# Patient Record
Sex: Female | Born: 1945 | Race: Black or African American | Hispanic: No | Smoking: Never smoker
Health system: Southern US, Community
[De-identification: ages and names within clinical notes are randomized; demographics above are authoritative.]

## PROBLEM LIST (undated history)

## (undated) HISTORY — PX: APPENDECTOMY: SHX54

## (undated) HISTORY — PX: OTHER SURGICAL HISTORY: SHX169

---

## 1989-01-11 HISTORY — PX: BREAST EXCISIONAL BIOPSY: SUR124

## 1998-04-28 ENCOUNTER — Other Ambulatory Visit: Admission: RE | Admit: 1998-04-28 | Discharge: 1998-04-28 | Payer: Self-pay | Admitting: Obstetrics & Gynecology

## 1998-08-21 ENCOUNTER — Ambulatory Visit (HOSPITAL_BASED_OUTPATIENT_CLINIC_OR_DEPARTMENT_OTHER): Admission: RE | Admit: 1998-08-21 | Discharge: 1998-08-21 | Payer: Self-pay | Admitting: Orthopedic Surgery

## 1998-11-17 ENCOUNTER — Encounter: Admission: RE | Admit: 1998-11-17 | Discharge: 1998-11-17 | Payer: Self-pay | Admitting: Rheumatology

## 1998-11-17 ENCOUNTER — Encounter: Payer: Self-pay | Admitting: Obstetrics & Gynecology

## 1999-03-05 ENCOUNTER — Ambulatory Visit (HOSPITAL_COMMUNITY): Admission: RE | Admit: 1999-03-05 | Discharge: 1999-03-05 | Payer: Self-pay | Admitting: Gastroenterology

## 1999-04-26 ENCOUNTER — Other Ambulatory Visit: Admission: RE | Admit: 1999-04-26 | Discharge: 1999-04-26 | Payer: Self-pay | Admitting: Gynecology

## 1999-04-26 ENCOUNTER — Encounter (INDEPENDENT_AMBULATORY_CARE_PROVIDER_SITE_OTHER): Payer: Self-pay

## 2000-08-05 ENCOUNTER — Encounter: Admission: RE | Admit: 2000-08-05 | Discharge: 2000-08-05 | Payer: Self-pay | Admitting: Family Medicine

## 2000-08-05 ENCOUNTER — Encounter: Payer: Self-pay | Admitting: Family Medicine

## 2002-01-12 ENCOUNTER — Encounter: Payer: Self-pay | Admitting: Family Medicine

## 2002-01-12 ENCOUNTER — Encounter: Admission: RE | Admit: 2002-01-12 | Discharge: 2002-01-12 | Payer: Self-pay | Admitting: Family Medicine

## 2003-01-25 ENCOUNTER — Encounter: Admission: RE | Admit: 2003-01-25 | Discharge: 2003-01-25 | Payer: Self-pay

## 2004-02-07 ENCOUNTER — Encounter: Admission: RE | Admit: 2004-02-07 | Discharge: 2004-02-07 | Payer: Self-pay | Admitting: Family Medicine

## 2005-05-20 ENCOUNTER — Encounter: Admission: RE | Admit: 2005-05-20 | Discharge: 2005-05-20 | Payer: Self-pay | Admitting: Family Medicine

## 2005-06-15 ENCOUNTER — Encounter: Admission: RE | Admit: 2005-06-15 | Discharge: 2005-06-15 | Payer: Self-pay | Admitting: Family Medicine

## 2005-07-01 ENCOUNTER — Encounter: Admission: RE | Admit: 2005-07-01 | Discharge: 2005-07-01 | Payer: Self-pay | Admitting: Family Medicine

## 2005-07-07 ENCOUNTER — Encounter: Admission: RE | Admit: 2005-07-07 | Discharge: 2005-07-07 | Payer: Self-pay | Admitting: Family Medicine

## 2009-09-11 ENCOUNTER — Other Ambulatory Visit: Admission: RE | Admit: 2009-09-11 | Discharge: 2009-09-11 | Payer: Self-pay | Admitting: Obstetrics and Gynecology

## 2010-01-31 ENCOUNTER — Encounter: Payer: Self-pay | Admitting: Family Medicine

## 2010-05-29 NOTE — Op Note (Signed)
[place]. [facility]  Patient:    Gail Strong, Gail Strong                   MRN: [id number] Proc. Date: 03/05/99 Adm. Date:  [id number] Attending:  Charna Elizabeth CC:         Doreatha Lew, M.D.                           Operative Report  REFERRING PHYSICIAN:  Doreatha Lew, M.D.  PROCEDURE:  Colonoscopy.  ENDOSCOPIST:  Anselmo Rod, M.D.  INSTRUMENT USED:  Olympus video colonoscope.  INDICATIONS:  Guaiac-positive stool in a 65 year old black female.  Rule out masses, polyps, hemorrhoids, etc.  PREPROCEDURE PREPARATION:  Informed consent was procured from the patient.  The  patient was fasted for eight hours prior to the procedure and prepped with a bottle of magnesium citrate and a gallon of NuLytely the night prior to the procedure.   PREPROCEDURE PHYSICAL EXAMINATION:  VITAL SIGNS: The patient had stable vital signs.  NECK:  Supple.  CHEST: Clear to auscultation.  HEART: S1 and S2 regular. ABDOMEN: Soft with normal abdominal bowel sounds.  DESCRIPTION OF PROCEDURE:  The patient was placed in the left lateral decubitus  position and sedated with 60 mg of Demerol and 6 mg of Versed intravenously. Once the patient was adequately sedated and maintained on low flow oxygen with continuous cardiac monitoring, the Olympus video colonoscope was advanced from he rectum to the cecum with extreme difficulty.  There was a large amount of residual stool in the colon.  No large masses or polyps were seen.  There were small nonbleeding internal and small external hemorrhoids seen.  The patient tolerated the procedure well.  The procedure was completed up to the cecum, but visualization was not adequate.  However, no large lesions were present.  The patient tolerated the procedure well.  A very small lesion may have been missed.  IMPRESSION: 1. Small nonbleeding internal and external hemorrhoids. 2. Large amount of residual stool in the colon.  No  large masses or polyps present. 3. Inadequate visualization of the colon.  Very small lesion may have been missed.  RECOMMENDATIONS:  Await outpatient follow-up in the next two weeks for repeat guaiac testing and further recommendations made thereafter. DD:  03/05/99 TD:  03/05/99 Job: [id number]

## 2010-09-10 ENCOUNTER — Inpatient Hospital Stay (INDEPENDENT_AMBULATORY_CARE_PROVIDER_SITE_OTHER)
Admission: RE | Admit: 2010-09-10 | Discharge: 2010-09-10 | Disposition: A | Discharge status: Home / Self Care | Payer: BC Managed Care – PPO | Source: Ambulatory Visit | Attending: Family Medicine | Admitting: Family Medicine

## 2010-09-10 DIAGNOSIS — L259 Unspecified contact dermatitis, unspecified cause: Secondary | ICD-10-CM

## 2010-10-17 ENCOUNTER — Emergency Department (INDEPENDENT_AMBULATORY_CARE_PROVIDER_SITE_OTHER): Payer: BC Managed Care – PPO

## 2010-10-17 ENCOUNTER — Emergency Department (HOSPITAL_BASED_OUTPATIENT_CLINIC_OR_DEPARTMENT_OTHER)
Admission: EM | Admit: 2010-10-17 | Discharge: 2010-10-17 | Disposition: A | Discharge status: Home / Self Care | Payer: BC Managed Care – PPO | Attending: Emergency Medicine | Admitting: Emergency Medicine

## 2010-10-17 ENCOUNTER — Encounter: Payer: Self-pay | Admitting: *Deleted

## 2010-10-17 DIAGNOSIS — R071 Chest pain on breathing: Secondary | ICD-10-CM

## 2010-10-17 DIAGNOSIS — M549 Dorsalgia, unspecified: Secondary | ICD-10-CM

## 2010-10-17 MED ORDER — NAPROXEN 500 MG PO TABS: 500.0000 mg | ORAL_TABLET | Freq: Two times a day (BID) | ORAL | Status: AC

## 2010-10-17 NOTE — ED Notes (Signed)
MD at bedside. EDP Miller in to assess pt

## 2010-10-17 NOTE — ED Provider Notes (Signed)
History     CSN: [id number] Arrival date & time: 10/17/2010  2:13 AM  Chief Complaint  Patient presents with  . Back Pain    (Consider location/radiation/quality/duration/timing/severity/associated sxs/prior treatment) HPI Comments: Patient is a very pleasant 65 year old female who presents after acute onset of left rhomboid area sharp and stabbing pain which occurred while she was trying to transition from a laying position to a upright position. This lasted for approximately one hour as he gradually improved and currently has minimal symptoms. She denies any pain with breathing or movement or palpation. She admits to having approximately 3 or 4 days of cough, congestion which she states is her usual change of the seasons cold.  Patient is a 65 y.o. female presenting with back pain. The history is provided by the patient.  Back Pain  This is a new problem. The current episode started 1 to 2 hours ago. The problem occurs constantly. The problem has been rapidly improving. The pain is associated with no known injury (Sitting up out of a laying down position). Pain location: Left upper back medial to the scapula, lateral to the spinous process. The quality of the pain is described as stabbing. The pain does not radiate. The pain is at a severity of 1/10. The pain is mild. Exacerbated by: Nothing. Pertinent negatives include no chest pain, no fever, no numbness, no headaches, no abdominal pain, no bowel incontinence, no perianal numbness, no bladder incontinence, no dysuria, no leg pain, no paresthesias, no paresis, no tingling and no weakness. She has tried nothing for the symptoms.    History reviewed. No pertinent past medical history.  Past Surgical History  Procedure Date  . Cyst removal breast and shoulder   . Appendectomy     No family history on file.  History  Substance Use Topics  . Smoking status: Never Smoker   . Smokeless tobacco: Not on file  . Alcohol Use: Yes     rare  wine    OB History    Grav Para Term Preterm Abortions TAB SAB Ect Mult Living                  Review of Systems  Constitutional: Negative for fever.  Cardiovascular: Negative for chest pain.  Gastrointestinal: Negative for abdominal pain and bowel incontinence.  Genitourinary: Negative for bladder incontinence and dysuria.  Musculoskeletal: Positive for back pain.  Neurological: Negative for tingling, weakness, numbness, headaches and paresthesias.  All other systems reviewed and are negative.    Allergies  Review of patient's allergies indicates no known allergies.  Home Medications  No current outpatient prescriptions on file.  BP 145/76  Pulse 71  Temp(Src) 98.6 F (37 C) (Oral)  Resp 20  SpO2 99%  Physical Exam  Nursing note and vitals reviewed. Constitutional: She appears well-developed and well-nourished. No distress.  HENT:  Head: Normocephalic and atraumatic.  Mouth/Throat: Oropharynx is clear and moist. No oropharyngeal exudate.  Eyes: Conjunctivae and EOM are normal. Pupils are equal, round, and reactive to light. Right eye exhibits no discharge. Left eye exhibits no discharge. No scleral icterus.  Neck: Normal range of motion. Neck supple. No JVD present. No thyromegaly present.  Cardiovascular: Normal rate, regular rhythm, normal heart sounds and intact distal pulses.  Exam reveals no gallop and no friction rub.   No murmur heard. Pulmonary/Chest: Effort normal and breath sounds normal. No respiratory distress. She has no wheezes. She has no rales. She exhibits no tenderness.  Abdominal: Soft. Bowel sounds  are normal. She exhibits no distension and no mass. There is no tenderness.  Musculoskeletal: Normal range of motion. She exhibits no edema and no tenderness.       No tenderness to palpation in the left back. No tenderness with stretching of the rhomboid muscles  Lymphadenopathy:    She has no cervical adenopathy.  Neurological: She is alert.  Coordination normal.  Skin: Skin is warm and dry. No rash noted. No erythema.  Psychiatric: She has a normal mood and affect. Her behavior is normal.    ED Course  Procedures (including critical care time)   No diagnosis found.    MDM  Lungs are auscultated and clear, vital signs are normal, abdominal exam benign, neurologic exam normal, gait normal. I suspect that her symptoms are related to the cough and cold that she has had over the last several days. The pain is consistent with a mild pleurisy which has almost completely resolved. The patient states she can hardly feel the symptoms at this time. We'll obtain a chest x-ray to rule out a small pneumothorax or pneumonia underlying this pain.  Xray negative - see report below - VS normal including sats and pulse.  Ibuprofen recommended, pt has near resolution of sx.  doubt any other source of symptoms given no chest pain, shortness of breath, swelling, fever, normal vital signs, well-appearing. Given that her symptoms started with the motion of trying to get to an upright position I suspect that this is related to pain in her intercostal muscles or a slight amount of pleurisy.  No results found for this or any previous visit. Dg Chest 2 View  10/17/2010  *RADIOLOGY REPORT*  Clinical Data: Left mid back pain, acute onset.  Pleuritic pain.  CHEST - 2 VIEW  Comparison: None.  Findings: Cardiomediastinal contours upper normal limits to mildly enlarged, with mild central vascular fullness.  No overt edema.  No pleural effusion or pneumothorax.  No focal areas of consolidation. Mild curvature of the thoracolumbar spine.  No aggressive osseous abnormality.  IMPRESSION: No focal consolidation.  Cardiomediastinal contours upper normal limits.  Original Report Authenticated By: Waneta Martins, M.D.        Vida Roller, MD 10/17/10 [phone]

## 2010-10-17 NOTE — ED Notes (Signed)
Pt reports cold sx since Sunday- tonight had stabbing pain in upper back which has now resolved by pt reports she wanted to "get it checked out anyway".

## 2012-02-02 ENCOUNTER — Other Ambulatory Visit: Payer: Self-pay | Admitting: Family Medicine

## 2012-02-02 DIAGNOSIS — R922 Inconclusive mammogram: Secondary | ICD-10-CM

## 2012-02-15 ENCOUNTER — Ambulatory Visit
Admission: RE | Admit: 2012-02-15 | Discharge: 2012-02-15 | Disposition: A | Discharge status: Home / Self Care | Payer: Medicare PPO | Source: Ambulatory Visit | Attending: Family Medicine | Admitting: Family Medicine

## 2012-02-15 DIAGNOSIS — R922 Inconclusive mammogram: Secondary | ICD-10-CM

## 2013-04-18 ENCOUNTER — Other Ambulatory Visit: Payer: Self-pay

## 2013-04-18 DIAGNOSIS — Z1231 Encounter for screening mammogram for malignant neoplasm of breast: Secondary | ICD-10-CM

## 2013-04-27 ENCOUNTER — Ambulatory Visit
Admission: RE | Admit: 2013-04-27 | Discharge: 2013-04-27 | Disposition: A | Discharge status: Home / Self Care | Payer: Medicare PPO | Source: Ambulatory Visit

## 2013-04-27 DIAGNOSIS — Z1231 Encounter for screening mammogram for malignant neoplasm of breast: Secondary | ICD-10-CM

## 2014-04-01 ENCOUNTER — Other Ambulatory Visit: Payer: Self-pay

## 2014-04-01 DIAGNOSIS — Z1231 Encounter for screening mammogram for malignant neoplasm of breast: Secondary | ICD-10-CM

## 2014-04-29 ENCOUNTER — Ambulatory Visit
Admission: RE | Admit: 2014-04-29 | Discharge: 2014-04-29 | Disposition: A | Discharge status: Home / Self Care | Payer: Medicare PPO | Source: Ambulatory Visit

## 2014-04-29 DIAGNOSIS — Z1231 Encounter for screening mammogram for malignant neoplasm of breast: Secondary | ICD-10-CM

## 2014-09-13 DIAGNOSIS — R030 Elevated blood-pressure reading, without diagnosis of hypertension: Secondary | ICD-10-CM | POA: Diagnosis not present

## 2014-09-13 DIAGNOSIS — Z23 Encounter for immunization: Secondary | ICD-10-CM | POA: Diagnosis not present

## 2014-09-13 DIAGNOSIS — M25561 Pain in right knee: Secondary | ICD-10-CM | POA: Diagnosis not present

## 2014-09-15 ENCOUNTER — Encounter (HOSPITAL_COMMUNITY): Payer: Self-pay | Admitting: *Deleted

## 2014-09-15 ENCOUNTER — Emergency Department (INDEPENDENT_AMBULATORY_CARE_PROVIDER_SITE_OTHER)
Admission: EM | Admit: 2014-09-15 | Discharge: 2014-09-15 | Disposition: A | Discharge status: Home / Self Care | Payer: Medicare PPO | Source: Home / Self Care | Attending: Family Medicine | Admitting: Family Medicine

## 2014-09-15 DIAGNOSIS — I10 Essential (primary) hypertension: Secondary | ICD-10-CM

## 2014-09-15 LAB — POCT I-STAT, CHEM 8
BUN: 18 mg/dL (ref 6–20)
CHLORIDE: 104 mmol/L (ref 101–111)
Calcium, Ion: 1.2 mmol/L (ref 1.13–1.30)
Creatinine, Ser: 0.8 mg/dL (ref 0.44–1.00)
Glucose, Bld: 142 mg/dL — ABNORMAL HIGH (ref 65–99)
HEMATOCRIT: 42 % (ref 36.0–46.0)
Hemoglobin: 14.3 g/dL (ref 12.0–15.0)
Potassium: 3.9 mmol/L (ref 3.5–5.1)
SODIUM: 141 mmol/L (ref 135–145)
TCO2: 26 mmol/L (ref 0–100)

## 2014-09-15 MED ORDER — HYDROCHLOROTHIAZIDE 25 MG PO TABS: 25.0000 mg | ORAL_TABLET | Freq: Every day | ORAL | Status: AC

## 2014-09-15 NOTE — ED Notes (Signed)
Pt  denys  A  History  Of  Hypertension     Pt reports  Her  bp  Was  High  This  Am  When   She  Checked         At  The  Pharmacy       she  denys  Any  Symptoms

## 2014-09-15 NOTE — Discharge Instructions (Signed)
See your doctor in 2 weeks as planned to recheck your blood pressure and your sugar to eval diabetes.

## 2014-09-15 NOTE — ED Provider Notes (Signed)
CSN: [id number]     Arrival date & time 09/15/14  1302 History   First MD Initiated Contact with Patient 09/15/14 1317     Chief Complaint  Patient presents with  . Hypertension   (Consider location/radiation/quality/duration/timing/severity/associated sxs/prior Treatment) Patient is a 69 y.o. female presenting with hypertension. The history is provided by the patient.  Hypertension This is a new problem. The current episode started more than 2 days ago (sl elevated bp on fri at [facility], was to have rechecked in 2 wks but yest and today was higher and pt became worried because mother had cva from hbp, , sister and brother with hbp.). The problem has been gradually worsening. Pertinent negatives include no chest pain, no abdominal pain, no headaches and no shortness of breath.    History reviewed. No pertinent past medical history. Past Surgical History  Procedure Laterality Date  . Cyst removal breast and shoulder    . Appendectomy     History reviewed. No pertinent family history. Social History  Substance Use Topics  . Smoking status: Never Smoker   . Smokeless tobacco: None  . Alcohol Use: Yes     Comment: rare wine   OB History    No data available     Review of Systems  Respiratory: Negative for shortness of breath.   Cardiovascular: Negative for chest pain, palpitations and leg swelling.  Gastrointestinal: Negative for abdominal pain.  Neurological: Negative for headaches.  All other systems reviewed and are negative.   Allergies  Review of patient's allergies indicates no known allergies.  Home Medications   Prior to Admission medications   Medication Sig Start Date End Date Taking? Authorizing Provider  hydrochlorothiazide (HYDRODIURIL) 25 MG tablet Take 1 tablet (25 mg total) by mouth daily. 09/15/14   Linna Hoff, MD   Meds Ordered and Administered this Visit  Medications - No data to display  BP 178/91 mmHg  Pulse 73  Temp(Src) 98.1 F (36.7 C) (Oral)   Resp 18  SpO2 97% No data found.   Physical Exam  Constitutional: She is oriented to person, place, and time. She appears well-developed and well-nourished.  Eyes: Pupils are equal, round, and reactive to light.  Neck: Normal range of motion. Neck supple. Normal carotid pulses and no JVD present. Carotid bruit is not present.  Cardiovascular: Normal rate, normal heart sounds and intact distal pulses.   Pulmonary/Chest: Effort normal and breath sounds normal.  Lymphadenopathy:    She has no cervical adenopathy.  Neurological: She is alert and oriented to person, place, and time.  Skin: Skin is warm and dry.  Nursing note and vitals reviewed.   ED Course  Procedures (including critical care time)  Labs Review Labs Reviewed  POCT I-STAT, CHEM 8 - Abnormal; Notable for the following:    Glucose, Bld 142 (*)    All other components within normal limits   i-stat wnl except bs 142 Imaging Review No results found.   Visual Acuity Review  Right Eye Distance:   Left Eye Distance:   Bilateral Distance:    Right Eye Near:   Left Eye Near:    Bilateral Near:         MDM   1. Essential hypertension     rx for hctz given until [facility] follow-up.    Linna Hoff, MD 09/15/14 [phone]

## 2014-10-01 DIAGNOSIS — M629 Disorder of muscle, unspecified: Secondary | ICD-10-CM | POA: Diagnosis not present

## 2014-10-01 DIAGNOSIS — M25561 Pain in right knee: Secondary | ICD-10-CM | POA: Diagnosis not present

## 2014-10-04 DIAGNOSIS — M629 Disorder of muscle, unspecified: Secondary | ICD-10-CM | POA: Diagnosis not present

## 2014-10-04 DIAGNOSIS — M25561 Pain in right knee: Secondary | ICD-10-CM | POA: Diagnosis not present

## 2014-10-08 DIAGNOSIS — I10 Essential (primary) hypertension: Secondary | ICD-10-CM | POA: Diagnosis not present

## 2014-10-09 DIAGNOSIS — M629 Disorder of muscle, unspecified: Secondary | ICD-10-CM | POA: Diagnosis not present

## 2014-10-09 DIAGNOSIS — M25561 Pain in right knee: Secondary | ICD-10-CM | POA: Diagnosis not present

## 2014-10-11 DIAGNOSIS — M25561 Pain in right knee: Secondary | ICD-10-CM | POA: Diagnosis not present

## 2014-10-11 DIAGNOSIS — M629 Disorder of muscle, unspecified: Secondary | ICD-10-CM | POA: Diagnosis not present

## 2014-10-15 DIAGNOSIS — M25561 Pain in right knee: Secondary | ICD-10-CM | POA: Diagnosis not present

## 2014-10-15 DIAGNOSIS — M629 Disorder of muscle, unspecified: Secondary | ICD-10-CM | POA: Diagnosis not present

## 2014-10-18 DIAGNOSIS — M629 Disorder of muscle, unspecified: Secondary | ICD-10-CM | POA: Diagnosis not present

## 2014-10-18 DIAGNOSIS — M25561 Pain in right knee: Secondary | ICD-10-CM | POA: Diagnosis not present

## 2014-10-22 DIAGNOSIS — M25561 Pain in right knee: Secondary | ICD-10-CM | POA: Diagnosis not present

## 2014-10-22 DIAGNOSIS — M629 Disorder of muscle, unspecified: Secondary | ICD-10-CM | POA: Diagnosis not present

## 2014-10-25 DIAGNOSIS — M629 Disorder of muscle, unspecified: Secondary | ICD-10-CM | POA: Diagnosis not present

## 2014-10-25 DIAGNOSIS — M25561 Pain in right knee: Secondary | ICD-10-CM | POA: Diagnosis not present

## 2014-10-29 DIAGNOSIS — M629 Disorder of muscle, unspecified: Secondary | ICD-10-CM | POA: Diagnosis not present

## 2014-10-29 DIAGNOSIS — M25561 Pain in right knee: Secondary | ICD-10-CM | POA: Diagnosis not present

## 2014-10-31 DIAGNOSIS — M25561 Pain in right knee: Secondary | ICD-10-CM | POA: Diagnosis not present

## 2014-10-31 DIAGNOSIS — M629 Disorder of muscle, unspecified: Secondary | ICD-10-CM | POA: Diagnosis not present

## 2014-11-05 DIAGNOSIS — M25561 Pain in right knee: Secondary | ICD-10-CM | POA: Diagnosis not present

## 2014-11-05 DIAGNOSIS — M629 Disorder of muscle, unspecified: Secondary | ICD-10-CM | POA: Diagnosis not present

## 2014-11-08 DIAGNOSIS — M25561 Pain in right knee: Secondary | ICD-10-CM | POA: Diagnosis not present

## 2014-11-08 DIAGNOSIS — M629 Disorder of muscle, unspecified: Secondary | ICD-10-CM | POA: Diagnosis not present

## 2014-11-12 DIAGNOSIS — M629 Disorder of muscle, unspecified: Secondary | ICD-10-CM | POA: Diagnosis not present

## 2014-11-12 DIAGNOSIS — M25561 Pain in right knee: Secondary | ICD-10-CM | POA: Diagnosis not present

## 2014-11-15 DIAGNOSIS — M629 Disorder of muscle, unspecified: Secondary | ICD-10-CM | POA: Diagnosis not present

## 2014-11-15 DIAGNOSIS — M25561 Pain in right knee: Secondary | ICD-10-CM | POA: Diagnosis not present

## 2014-11-19 DIAGNOSIS — M25561 Pain in right knee: Secondary | ICD-10-CM | POA: Diagnosis not present

## 2014-11-19 DIAGNOSIS — M629 Disorder of muscle, unspecified: Secondary | ICD-10-CM | POA: Diagnosis not present

## 2014-11-22 DIAGNOSIS — M629 Disorder of muscle, unspecified: Secondary | ICD-10-CM | POA: Diagnosis not present

## 2014-11-22 DIAGNOSIS — M25561 Pain in right knee: Secondary | ICD-10-CM | POA: Diagnosis not present

## 2015-04-04 ENCOUNTER — Other Ambulatory Visit: Payer: Self-pay

## 2015-04-04 DIAGNOSIS — Z1231 Encounter for screening mammogram for malignant neoplasm of breast: Secondary | ICD-10-CM

## 2015-05-05 ENCOUNTER — Ambulatory Visit
Admission: RE | Admit: 2015-05-05 | Discharge: 2015-05-05 | Disposition: A | Discharge status: Home / Self Care | Payer: Medicare Other | Source: Ambulatory Visit

## 2015-05-05 DIAGNOSIS — Z1231 Encounter for screening mammogram for malignant neoplasm of breast: Secondary | ICD-10-CM

## 2015-09-19 ENCOUNTER — Other Ambulatory Visit (HOSPITAL_COMMUNITY)
Admission: RE | Admit: 2015-09-19 | Discharge: 2015-09-19 | Disposition: A | Discharge status: Home / Self Care | Payer: Medicare Other | Source: Ambulatory Visit | Attending: Family Medicine | Admitting: Family Medicine

## 2015-09-19 ENCOUNTER — Other Ambulatory Visit: Payer: Self-pay | Admitting: Family Medicine

## 2015-09-19 DIAGNOSIS — Z01411 Encounter for gynecological examination (general) (routine) with abnormal findings: Secondary | ICD-10-CM | POA: Insufficient documentation

## 2015-09-23 LAB — CYTOLOGY - PAP

## 2016-03-25 ENCOUNTER — Other Ambulatory Visit: Payer: Self-pay | Admitting: Family Medicine

## 2016-03-25 DIAGNOSIS — Z1231 Encounter for screening mammogram for malignant neoplasm of breast: Secondary | ICD-10-CM

## 2016-05-06 ENCOUNTER — Ambulatory Visit: Payer: Medicare PPO

## 2016-05-14 ENCOUNTER — Ambulatory Visit
Admission: RE | Admit: 2016-05-14 | Discharge: 2016-05-14 | Disposition: A | Discharge status: Home / Self Care | Payer: Medicare Other | Source: Ambulatory Visit | Attending: Family Medicine | Admitting: Family Medicine

## 2016-05-14 DIAGNOSIS — Z1231 Encounter for screening mammogram for malignant neoplasm of breast: Secondary | ICD-10-CM

## 2017-04-19 ENCOUNTER — Other Ambulatory Visit: Payer: Self-pay | Admitting: Family Medicine

## 2017-04-19 DIAGNOSIS — Z1231 Encounter for screening mammogram for malignant neoplasm of breast: Secondary | ICD-10-CM

## 2017-05-18 ENCOUNTER — Ambulatory Visit: Payer: Medicare Other

## 2017-06-10 ENCOUNTER — Ambulatory Visit
Admission: RE | Admit: 2017-06-10 | Discharge: 2017-06-10 | Disposition: A | Discharge status: Home / Self Care | Payer: Medicare Other | Source: Ambulatory Visit | Attending: Family Medicine | Admitting: Family Medicine

## 2017-06-10 DIAGNOSIS — Z1231 Encounter for screening mammogram for malignant neoplasm of breast: Secondary | ICD-10-CM

## 2018-05-30 ENCOUNTER — Other Ambulatory Visit: Payer: Self-pay | Admitting: Family Medicine

## 2018-05-30 DIAGNOSIS — Z9289 Personal history of other medical treatment: Secondary | ICD-10-CM

## 2018-07-21 ENCOUNTER — Ambulatory Visit
Admission: RE | Admit: 2018-07-21 | Discharge: 2018-07-21 | Disposition: A | Discharge status: Home / Self Care | Payer: Medicare Other | Source: Ambulatory Visit | Attending: Family Medicine | Admitting: Family Medicine

## 2018-07-21 ENCOUNTER — Other Ambulatory Visit: Payer: Self-pay

## 2018-07-21 DIAGNOSIS — Z9289 Personal history of other medical treatment: Secondary | ICD-10-CM

## 2019-03-15 DIAGNOSIS — K644 Residual hemorrhoidal skin tags: Secondary | ICD-10-CM | POA: Diagnosis not present

## 2019-04-25 DIAGNOSIS — Z6833 Body mass index (BMI) 33.0-33.9, adult: Secondary | ICD-10-CM | POA: Diagnosis not present

## 2019-04-25 DIAGNOSIS — I10 Essential (primary) hypertension: Secondary | ICD-10-CM | POA: Diagnosis not present

## 2019-04-25 DIAGNOSIS — E6609 Other obesity due to excess calories: Secondary | ICD-10-CM | POA: Diagnosis not present

## 2019-04-25 DIAGNOSIS — E1169 Type 2 diabetes mellitus with other specified complication: Secondary | ICD-10-CM | POA: Diagnosis not present

## 2019-04-25 DIAGNOSIS — E782 Mixed hyperlipidemia: Secondary | ICD-10-CM | POA: Diagnosis not present

## 2019-06-25 ENCOUNTER — Other Ambulatory Visit: Payer: Self-pay | Admitting: Family Medicine

## 2019-06-25 DIAGNOSIS — Z1231 Encounter for screening mammogram for malignant neoplasm of breast: Secondary | ICD-10-CM

## 2019-07-26 ENCOUNTER — Other Ambulatory Visit: Payer: Self-pay

## 2019-07-26 ENCOUNTER — Ambulatory Visit
Admission: RE | Admit: 2019-07-26 | Discharge: 2019-07-26 | Disposition: A | Discharge status: Home / Self Care | Payer: Medicare PPO | Source: Ambulatory Visit | Attending: Family Medicine | Admitting: Family Medicine

## 2019-07-26 DIAGNOSIS — Z1231 Encounter for screening mammogram for malignant neoplasm of breast: Secondary | ICD-10-CM

## 2019-10-02 ENCOUNTER — Ambulatory Visit: Payer: Medicare PPO | Attending: Internal Medicine

## 2019-10-02 DIAGNOSIS — Z23 Encounter for immunization: Secondary | ICD-10-CM

## 2019-10-02 NOTE — Progress Notes (Signed)
   Covid-19 Vaccination Clinic  Name:  Gail Strong    MRN: [id number] DOB: September 21, 1945  10/02/2019  [name] was observed post Covid-19 immunization for 15 minutes without incident. She was provided with Vaccine Information Sheet and instruction to access the V-Safe system.   [name] was instructed to call 911 with any severe reactions post vaccine: Marland Kitchen Difficulty breathing  . Swelling of face and throat  . A fast heartbeat  . A bad rash all over body  . Dizziness and weakness

## 2019-10-08 ENCOUNTER — Other Ambulatory Visit: Payer: Medicare PPO

## 2019-10-08 DIAGNOSIS — Z20822 Contact with and (suspected) exposure to covid-19: Secondary | ICD-10-CM | POA: Diagnosis not present

## 2019-10-09 LAB — NOVEL CORONAVIRUS, NAA: SARS-CoV-2, NAA: NOT DETECTED

## 2019-10-09 LAB — SARS-COV-2, NAA 2 DAY TAT

## 2019-10-19 DIAGNOSIS — E785 Hyperlipidemia, unspecified: Secondary | ICD-10-CM | POA: Diagnosis not present

## 2019-10-19 DIAGNOSIS — I10 Essential (primary) hypertension: Secondary | ICD-10-CM | POA: Diagnosis not present

## 2019-10-19 DIAGNOSIS — Z823 Family history of stroke: Secondary | ICD-10-CM | POA: Diagnosis not present

## 2019-10-19 DIAGNOSIS — Z6830 Body mass index (BMI) 30.0-30.9, adult: Secondary | ICD-10-CM | POA: Diagnosis not present

## 2019-10-19 DIAGNOSIS — E669 Obesity, unspecified: Secondary | ICD-10-CM | POA: Diagnosis not present

## 2019-10-19 DIAGNOSIS — Z809 Family history of malignant neoplasm, unspecified: Secondary | ICD-10-CM | POA: Diagnosis not present

## 2019-10-19 DIAGNOSIS — Z833 Family history of diabetes mellitus: Secondary | ICD-10-CM | POA: Diagnosis not present

## 2019-10-19 DIAGNOSIS — Z8249 Family history of ischemic heart disease and other diseases of the circulatory system: Secondary | ICD-10-CM | POA: Diagnosis not present

## 2019-10-19 DIAGNOSIS — Z87892 Personal history of anaphylaxis: Secondary | ICD-10-CM | POA: Diagnosis not present

## 2019-11-05 DIAGNOSIS — I10 Essential (primary) hypertension: Secondary | ICD-10-CM | POA: Diagnosis not present

## 2019-11-05 DIAGNOSIS — E1169 Type 2 diabetes mellitus with other specified complication: Secondary | ICD-10-CM | POA: Diagnosis not present

## 2019-11-05 DIAGNOSIS — E782 Mixed hyperlipidemia: Secondary | ICD-10-CM | POA: Diagnosis not present

## 2019-11-05 DIAGNOSIS — Z23 Encounter for immunization: Secondary | ICD-10-CM | POA: Diagnosis not present

## 2019-11-05 DIAGNOSIS — Z0000 Encounter for general adult medical examination without abnormal findings: Secondary | ICD-10-CM | POA: Diagnosis not present

## 2019-11-05 DIAGNOSIS — Z1389 Encounter for screening for other disorder: Secondary | ICD-10-CM | POA: Diagnosis not present

## 2019-11-05 DIAGNOSIS — E6609 Other obesity due to excess calories: Secondary | ICD-10-CM | POA: Diagnosis not present

## 2019-11-15 DIAGNOSIS — H43393 Other vitreous opacities, bilateral: Secondary | ICD-10-CM | POA: Diagnosis not present

## 2019-11-15 DIAGNOSIS — H524 Presbyopia: Secondary | ICD-10-CM | POA: Diagnosis not present

## 2020-02-01 ENCOUNTER — Other Ambulatory Visit: Payer: Medicare PPO

## 2020-02-01 DIAGNOSIS — Z20822 Contact with and (suspected) exposure to covid-19: Secondary | ICD-10-CM | POA: Diagnosis not present

## 2020-02-02 LAB — SARS-COV-2, NAA 2 DAY TAT

## 2020-02-02 LAB — NOVEL CORONAVIRUS, NAA: SARS-CoV-2, NAA: NOT DETECTED

## 2020-02-03 ENCOUNTER — Telehealth: Payer: Self-pay | Admitting: Family Medicine

## 2020-02-03 NOTE — Telephone Encounter (Signed)
Patient is calling to receive her negative COVID test results. Patient expressed understanding. 

## 2020-04-21 ENCOUNTER — Other Ambulatory Visit: Payer: Self-pay

## 2020-04-21 ENCOUNTER — Ambulatory Visit: Payer: Medicare PPO | Attending: Internal Medicine

## 2020-04-21 DIAGNOSIS — Z23 Encounter for immunization: Secondary | ICD-10-CM

## 2020-04-21 NOTE — Progress Notes (Signed)
   Covid-19 Vaccination Clinic  Name:  Gail Strong    MRN: [id number] DOB: December 08, 1945  04/21/2020  [name] was observed post Covid-19 immunization for 15 minutes without incident. She was provided with Vaccine Information Sheet and instruction to access the V-Safe system.   [name] was instructed to call 911 with any severe reactions post vaccine: Marland Kitchen Difficulty breathing  . Swelling of face and throat  . A fast heartbeat  . A bad rash all over body  . Dizziness and weakness   Immunizations Administered    Name Date Dose VIS Date Route   PFIZER Comrnaty(Gray TOP) Covid-19 Vaccine 04/21/2020 11:48 AM 0.3 mL 12/20/2019 Intramuscular   Manufacturer: [employer], [employer]   Lot: [id number]   NDC: [phone]

## 2020-04-24 ENCOUNTER — Other Ambulatory Visit (HOSPITAL_BASED_OUTPATIENT_CLINIC_OR_DEPARTMENT_OTHER): Payer: Self-pay

## 2020-04-29 ENCOUNTER — Other Ambulatory Visit (HOSPITAL_BASED_OUTPATIENT_CLINIC_OR_DEPARTMENT_OTHER): Payer: Self-pay

## 2020-04-29 MED ORDER — COVID-19 MRNA VAC-TRIS(PFIZER) 30 MCG/0.3ML IM SUSP
INTRAMUSCULAR | 0 refills | Status: AC
  Filled 2020-04-29: qty 0.3, 1d supply, fill #0

## 2020-05-05 DIAGNOSIS — E782 Mixed hyperlipidemia: Secondary | ICD-10-CM | POA: Diagnosis not present

## 2020-05-05 DIAGNOSIS — I10 Essential (primary) hypertension: Secondary | ICD-10-CM | POA: Diagnosis not present

## 2020-05-05 DIAGNOSIS — E1169 Type 2 diabetes mellitus with other specified complication: Secondary | ICD-10-CM | POA: Diagnosis not present

## 2020-05-09 ENCOUNTER — Other Ambulatory Visit (HOSPITAL_BASED_OUTPATIENT_CLINIC_OR_DEPARTMENT_OTHER): Payer: Self-pay

## 2020-06-24 ENCOUNTER — Other Ambulatory Visit: Payer: Self-pay | Admitting: Family Medicine

## 2020-06-24 DIAGNOSIS — Z1231 Encounter for screening mammogram for malignant neoplasm of breast: Secondary | ICD-10-CM

## 2020-07-02 DIAGNOSIS — L0211 Cutaneous abscess of neck: Secondary | ICD-10-CM | POA: Diagnosis not present

## 2020-07-23 DIAGNOSIS — L723 Sebaceous cyst: Secondary | ICD-10-CM | POA: Diagnosis not present

## 2020-08-18 ENCOUNTER — Ambulatory Visit
Admission: RE | Admit: 2020-08-18 | Discharge: 2020-08-18 | Disposition: A | Discharge status: Home / Self Care | Payer: Medicare PPO | Source: Ambulatory Visit | Attending: Family Medicine | Admitting: Family Medicine

## 2020-08-18 ENCOUNTER — Other Ambulatory Visit: Payer: Self-pay

## 2020-08-18 DIAGNOSIS — Z1231 Encounter for screening mammogram for malignant neoplasm of breast: Secondary | ICD-10-CM | POA: Diagnosis not present

## 2020-09-04 DIAGNOSIS — Z20822 Contact with and (suspected) exposure to covid-19: Secondary | ICD-10-CM | POA: Diagnosis not present

## 2020-09-25 DIAGNOSIS — Z823 Family history of stroke: Secondary | ICD-10-CM | POA: Diagnosis not present

## 2020-09-25 DIAGNOSIS — Z8042 Family history of malignant neoplasm of prostate: Secondary | ICD-10-CM | POA: Diagnosis not present

## 2020-09-25 DIAGNOSIS — E785 Hyperlipidemia, unspecified: Secondary | ICD-10-CM | POA: Diagnosis not present

## 2020-09-25 DIAGNOSIS — E669 Obesity, unspecified: Secondary | ICD-10-CM | POA: Diagnosis not present

## 2020-09-25 DIAGNOSIS — K08409 Partial loss of teeth, unspecified cause, unspecified class: Secondary | ICD-10-CM | POA: Diagnosis not present

## 2020-09-25 DIAGNOSIS — Z8249 Family history of ischemic heart disease and other diseases of the circulatory system: Secondary | ICD-10-CM | POA: Diagnosis not present

## 2020-09-25 DIAGNOSIS — G8929 Other chronic pain: Secondary | ICD-10-CM | POA: Diagnosis not present

## 2020-09-25 DIAGNOSIS — I10 Essential (primary) hypertension: Secondary | ICD-10-CM | POA: Diagnosis not present

## 2020-09-25 DIAGNOSIS — Z6831 Body mass index (BMI) 31.0-31.9, adult: Secondary | ICD-10-CM | POA: Diagnosis not present

## 2020-11-18 DIAGNOSIS — E782 Mixed hyperlipidemia: Secondary | ICD-10-CM | POA: Diagnosis not present

## 2020-11-18 DIAGNOSIS — Z1389 Encounter for screening for other disorder: Secondary | ICD-10-CM | POA: Diagnosis not present

## 2020-11-18 DIAGNOSIS — Z0000 Encounter for general adult medical examination without abnormal findings: Secondary | ICD-10-CM | POA: Diagnosis not present

## 2020-11-18 DIAGNOSIS — E1169 Type 2 diabetes mellitus with other specified complication: Secondary | ICD-10-CM | POA: Diagnosis not present

## 2020-11-18 DIAGNOSIS — E6609 Other obesity due to excess calories: Secondary | ICD-10-CM | POA: Diagnosis not present

## 2020-11-18 DIAGNOSIS — I10 Essential (primary) hypertension: Secondary | ICD-10-CM | POA: Diagnosis not present

## 2020-12-27 DIAGNOSIS — H524 Presbyopia: Secondary | ICD-10-CM | POA: Diagnosis not present

## 2020-12-27 DIAGNOSIS — H43393 Other vitreous opacities, bilateral: Secondary | ICD-10-CM | POA: Diagnosis not present

## 2021-03-13 DIAGNOSIS — Z8249 Family history of ischemic heart disease and other diseases of the circulatory system: Secondary | ICD-10-CM | POA: Diagnosis not present

## 2021-03-13 DIAGNOSIS — Z809 Family history of malignant neoplasm, unspecified: Secondary | ICD-10-CM | POA: Diagnosis not present

## 2021-03-13 DIAGNOSIS — E785 Hyperlipidemia, unspecified: Secondary | ICD-10-CM | POA: Diagnosis not present

## 2021-03-13 DIAGNOSIS — I10 Essential (primary) hypertension: Secondary | ICD-10-CM | POA: Diagnosis not present

## 2021-05-20 DIAGNOSIS — I10 Essential (primary) hypertension: Secondary | ICD-10-CM | POA: Diagnosis not present

## 2021-05-20 DIAGNOSIS — E782 Mixed hyperlipidemia: Secondary | ICD-10-CM | POA: Diagnosis not present

## 2021-05-20 DIAGNOSIS — E1169 Type 2 diabetes mellitus with other specified complication: Secondary | ICD-10-CM | POA: Diagnosis not present

## 2021-07-17 ENCOUNTER — Other Ambulatory Visit: Payer: Self-pay | Admitting: Family Medicine

## 2021-07-17 DIAGNOSIS — Z1231 Encounter for screening mammogram for malignant neoplasm of breast: Secondary | ICD-10-CM

## 2021-09-07 ENCOUNTER — Ambulatory Visit
Admission: RE | Admit: 2021-09-07 | Discharge: 2021-09-07 | Disposition: A | Discharge status: Home / Self Care | Payer: Medicare PPO | Source: Ambulatory Visit | Attending: Family Medicine | Admitting: Family Medicine

## 2021-09-07 DIAGNOSIS — Z1231 Encounter for screening mammogram for malignant neoplasm of breast: Secondary | ICD-10-CM

## 2021-09-18 ENCOUNTER — Ambulatory Visit: Payer: Medicare PPO

## 2021-11-24 DIAGNOSIS — H524 Presbyopia: Secondary | ICD-10-CM | POA: Diagnosis not present

## 2021-11-24 DIAGNOSIS — H43393 Other vitreous opacities, bilateral: Secondary | ICD-10-CM | POA: Diagnosis not present

## 2021-12-16 DIAGNOSIS — Z1331 Encounter for screening for depression: Secondary | ICD-10-CM | POA: Diagnosis not present

## 2021-12-16 DIAGNOSIS — E6609 Other obesity due to excess calories: Secondary | ICD-10-CM | POA: Diagnosis not present

## 2021-12-16 DIAGNOSIS — E782 Mixed hyperlipidemia: Secondary | ICD-10-CM | POA: Diagnosis not present

## 2021-12-16 DIAGNOSIS — Z0000 Encounter for general adult medical examination without abnormal findings: Secondary | ICD-10-CM | POA: Diagnosis not present

## 2021-12-16 DIAGNOSIS — E1169 Type 2 diabetes mellitus with other specified complication: Secondary | ICD-10-CM | POA: Diagnosis not present

## 2021-12-16 DIAGNOSIS — I10 Essential (primary) hypertension: Secondary | ICD-10-CM | POA: Diagnosis not present

## 2021-12-17 ENCOUNTER — Other Ambulatory Visit: Payer: Self-pay | Admitting: Family Medicine

## 2021-12-17 DIAGNOSIS — Z1231 Encounter for screening mammogram for malignant neoplasm of breast: Secondary | ICD-10-CM

## 2021-12-17 DIAGNOSIS — E2839 Other primary ovarian failure: Secondary | ICD-10-CM

## 2022-03-10 DIAGNOSIS — L658 Other specified nonscarring hair loss: Secondary | ICD-10-CM | POA: Diagnosis not present

## 2022-03-16 DIAGNOSIS — L658 Other specified nonscarring hair loss: Secondary | ICD-10-CM | POA: Diagnosis not present

## 2022-05-20 DIAGNOSIS — Z91040 Latex allergy status: Secondary | ICD-10-CM | POA: Diagnosis not present

## 2022-05-20 DIAGNOSIS — E785 Hyperlipidemia, unspecified: Secondary | ICD-10-CM | POA: Diagnosis not present

## 2022-05-20 DIAGNOSIS — Z91048 Other nonmedicinal substance allergy status: Secondary | ICD-10-CM | POA: Diagnosis not present

## 2022-05-20 DIAGNOSIS — Z87892 Personal history of anaphylaxis: Secondary | ICD-10-CM | POA: Diagnosis not present

## 2022-05-20 DIAGNOSIS — I10 Essential (primary) hypertension: Secondary | ICD-10-CM | POA: Diagnosis not present

## 2022-05-20 DIAGNOSIS — Z833 Family history of diabetes mellitus: Secondary | ICD-10-CM | POA: Diagnosis not present

## 2022-06-14 ENCOUNTER — Ambulatory Visit
Admission: RE | Admit: 2022-06-14 | Discharge: 2022-06-14 | Disposition: A | Discharge status: Home / Self Care | Payer: Medicare PPO | Source: Ambulatory Visit | Attending: Family Medicine | Admitting: Family Medicine

## 2022-06-14 DIAGNOSIS — E2839 Other primary ovarian failure: Secondary | ICD-10-CM

## 2022-06-14 DIAGNOSIS — E349 Endocrine disorder, unspecified: Secondary | ICD-10-CM | POA: Diagnosis not present

## 2022-06-14 DIAGNOSIS — R29890 Loss of height: Secondary | ICD-10-CM | POA: Diagnosis not present

## 2022-06-14 DIAGNOSIS — M810 Age-related osteoporosis without current pathological fracture: Secondary | ICD-10-CM | POA: Diagnosis not present

## 2022-06-14 DIAGNOSIS — N951 Menopausal and female climacteric states: Secondary | ICD-10-CM | POA: Diagnosis not present

## 2022-06-22 DIAGNOSIS — I10 Essential (primary) hypertension: Secondary | ICD-10-CM | POA: Diagnosis not present

## 2022-06-22 DIAGNOSIS — E782 Mixed hyperlipidemia: Secondary | ICD-10-CM | POA: Diagnosis not present

## 2022-06-22 DIAGNOSIS — E1169 Type 2 diabetes mellitus with other specified complication: Secondary | ICD-10-CM | POA: Diagnosis not present

## 2022-06-22 DIAGNOSIS — E119 Type 2 diabetes mellitus without complications: Secondary | ICD-10-CM | POA: Diagnosis not present

## 2022-09-10 ENCOUNTER — Ambulatory Visit: Payer: Medicare PPO

## 2022-09-10 ENCOUNTER — Ambulatory Visit
Admission: RE | Admit: 2022-09-10 | Discharge: 2022-09-10 | Disposition: A | Discharge status: Home / Self Care | Payer: Medicare PPO | Source: Ambulatory Visit | Attending: Family Medicine | Admitting: Family Medicine

## 2022-09-10 DIAGNOSIS — Z1231 Encounter for screening mammogram for malignant neoplasm of breast: Secondary | ICD-10-CM | POA: Diagnosis not present

## 2022-09-15 ENCOUNTER — Other Ambulatory Visit: Payer: Self-pay | Admitting: Family Medicine

## 2022-09-15 DIAGNOSIS — R928 Other abnormal and inconclusive findings on diagnostic imaging of breast: Secondary | ICD-10-CM

## 2022-09-24 ENCOUNTER — Ambulatory Visit
Admission: RE | Admit: 2022-09-24 | Discharge: 2022-09-24 | Disposition: A | Discharge status: Home / Self Care | Payer: Medicare PPO | Source: Ambulatory Visit | Attending: Family Medicine | Admitting: Family Medicine

## 2022-09-24 DIAGNOSIS — R928 Other abnormal and inconclusive findings on diagnostic imaging of breast: Secondary | ICD-10-CM

## 2022-09-24 DIAGNOSIS — N6321 Unspecified lump in the left breast, upper outer quadrant: Secondary | ICD-10-CM | POA: Diagnosis not present

## 2022-12-14 DIAGNOSIS — H43393 Other vitreous opacities, bilateral: Secondary | ICD-10-CM | POA: Diagnosis not present

## 2023-01-21 DIAGNOSIS — E782 Mixed hyperlipidemia: Secondary | ICD-10-CM | POA: Diagnosis not present

## 2023-01-21 DIAGNOSIS — Z0000 Encounter for general adult medical examination without abnormal findings: Secondary | ICD-10-CM | POA: Diagnosis not present

## 2023-01-21 DIAGNOSIS — E1169 Type 2 diabetes mellitus with other specified complication: Secondary | ICD-10-CM | POA: Diagnosis not present

## 2023-01-21 DIAGNOSIS — Z1331 Encounter for screening for depression: Secondary | ICD-10-CM | POA: Diagnosis not present

## 2023-01-21 DIAGNOSIS — I10 Essential (primary) hypertension: Secondary | ICD-10-CM | POA: Diagnosis not present

## 2023-01-21 DIAGNOSIS — M85851 Other specified disorders of bone density and structure, right thigh: Secondary | ICD-10-CM | POA: Diagnosis not present

## 2023-01-21 DIAGNOSIS — E6609 Other obesity due to excess calories: Secondary | ICD-10-CM | POA: Diagnosis not present

## 2023-07-06 IMAGING — MG MM DIGITAL SCREENING BILAT W/ TOMO AND CAD
6 of 12 series · 6 of 36 positions shown · non-contrast
Comparison: Previous exam(s).

CLINICAL DATA: Screening.

EXAM:
DIGITAL SCREENING BILATERAL MAMMOGRAM WITH TOMOSYNTHESIS AND CAD
TECHNIQUE: Bilateral screening digital craniocaudal and mediolateral oblique
mammograms were obtained. Bilateral screening digital breast
tomosynthesis was performed. The images were evaluated with
computer-aided detection.

[L MLO synth-2D (1 of 2)]
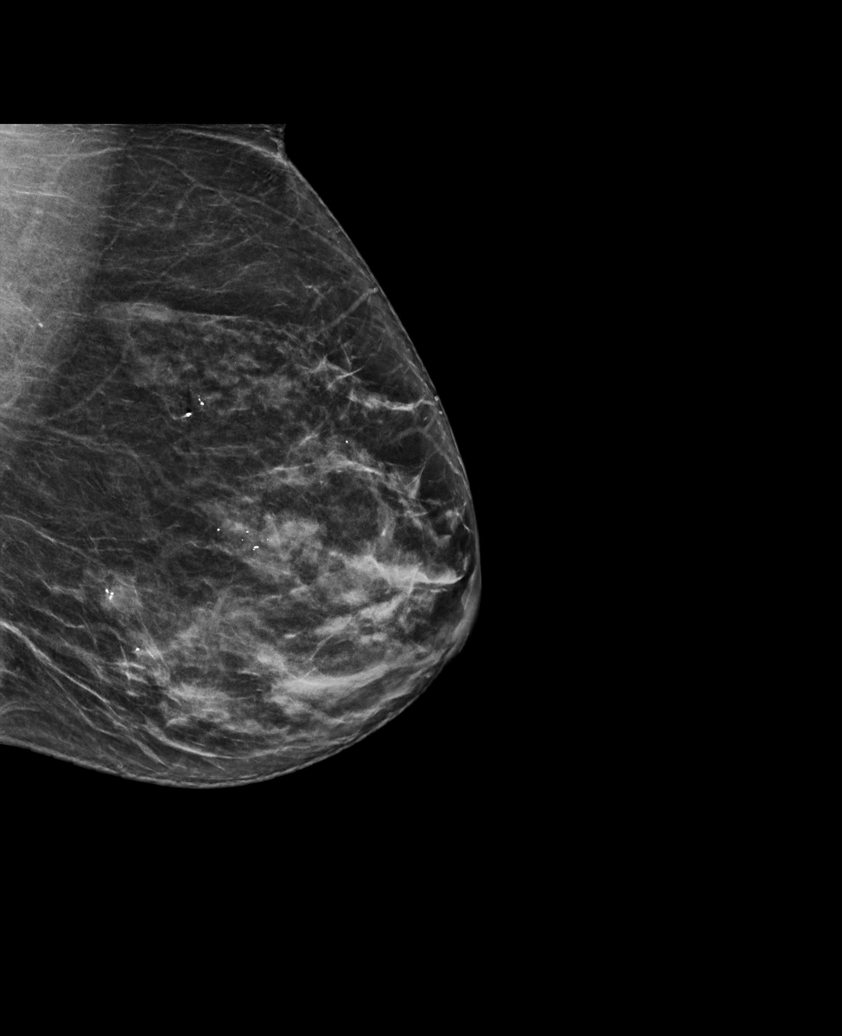

[R MLO synth-2D (1 of 2)]
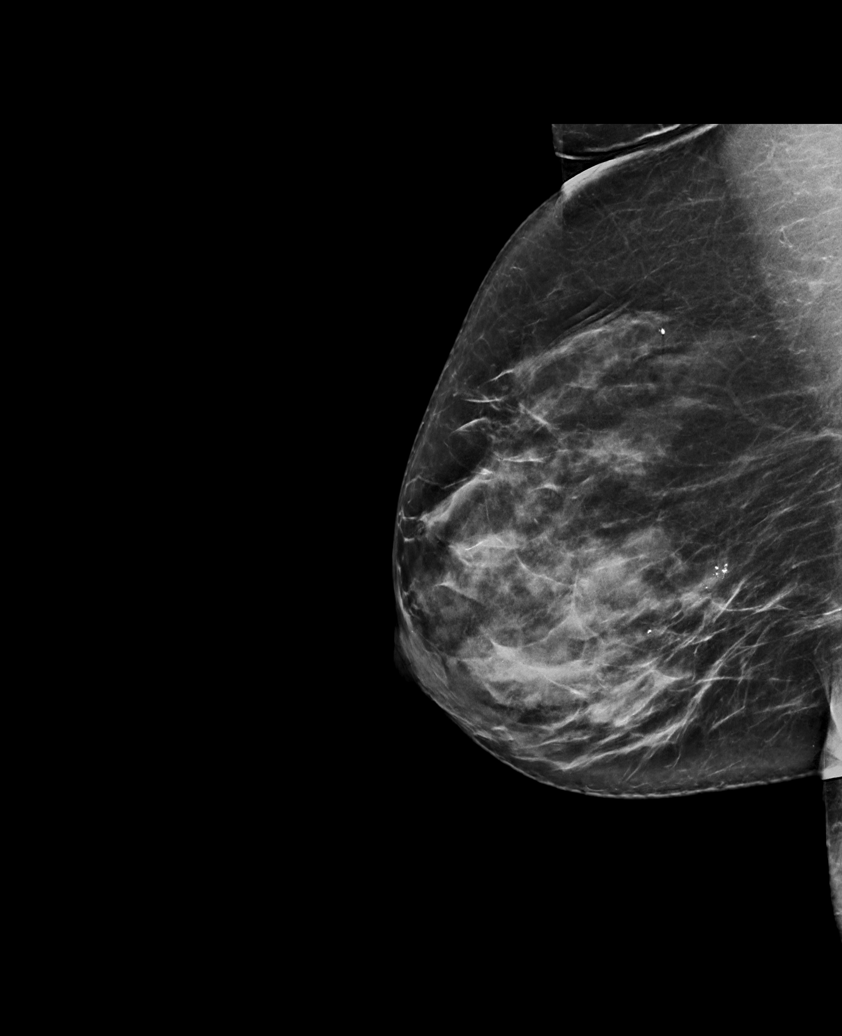

[R MLO synth-2D (2 of 2)]
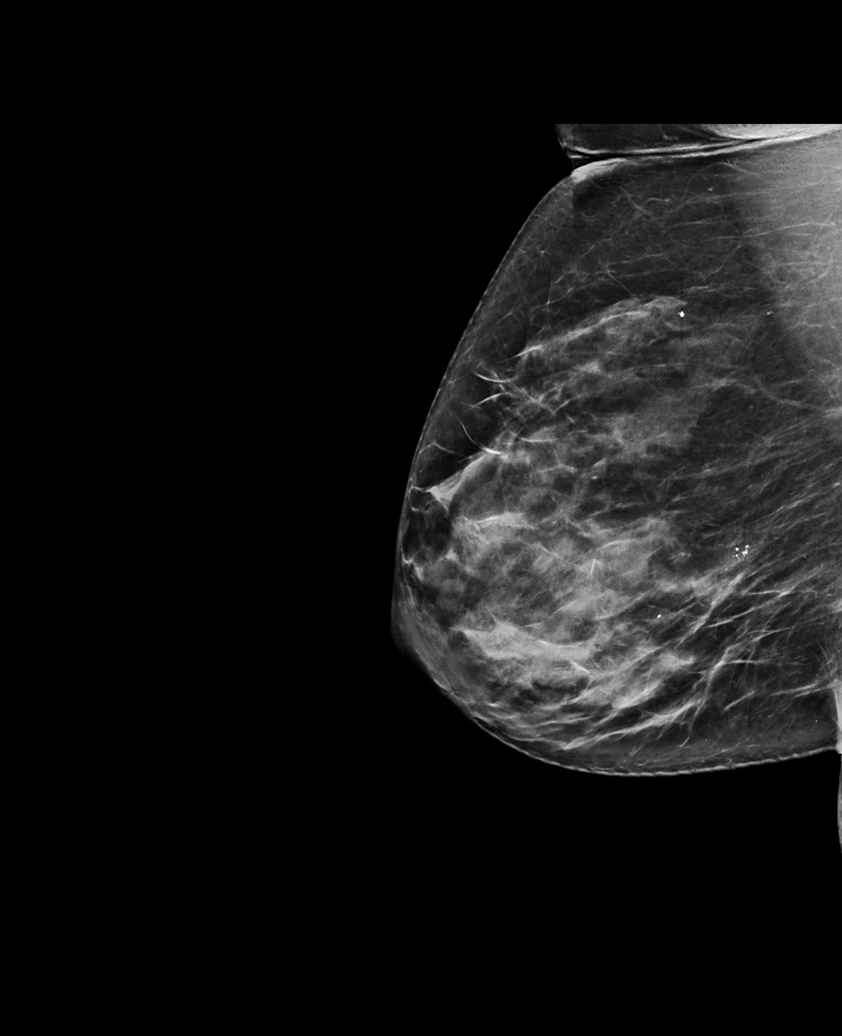

[R CC synth-2D]
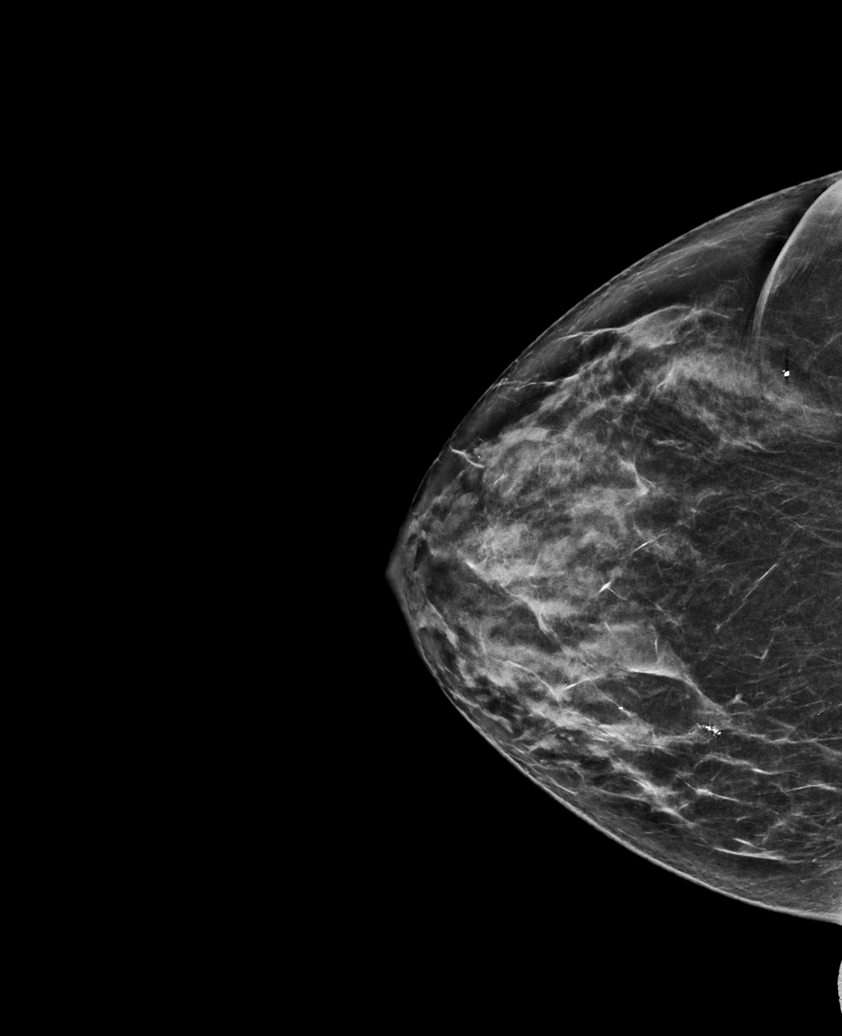

[L CC synth-2D]
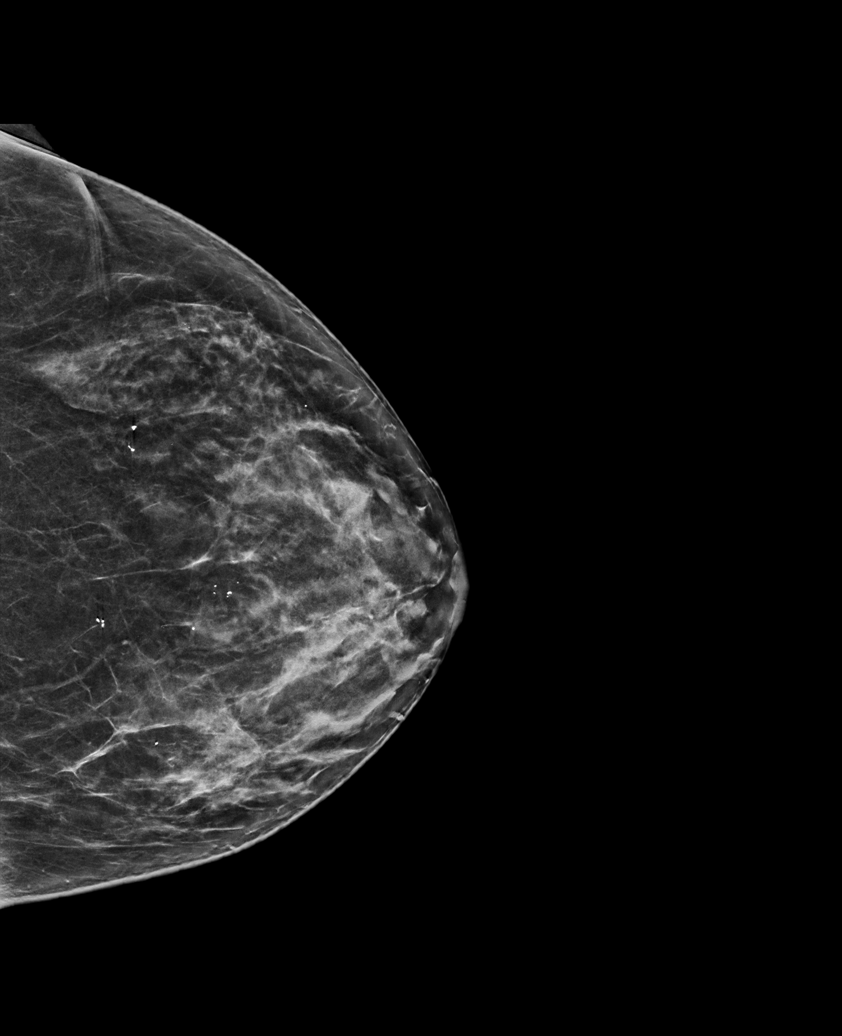

[L MLO synth-2D (2 of 2)]
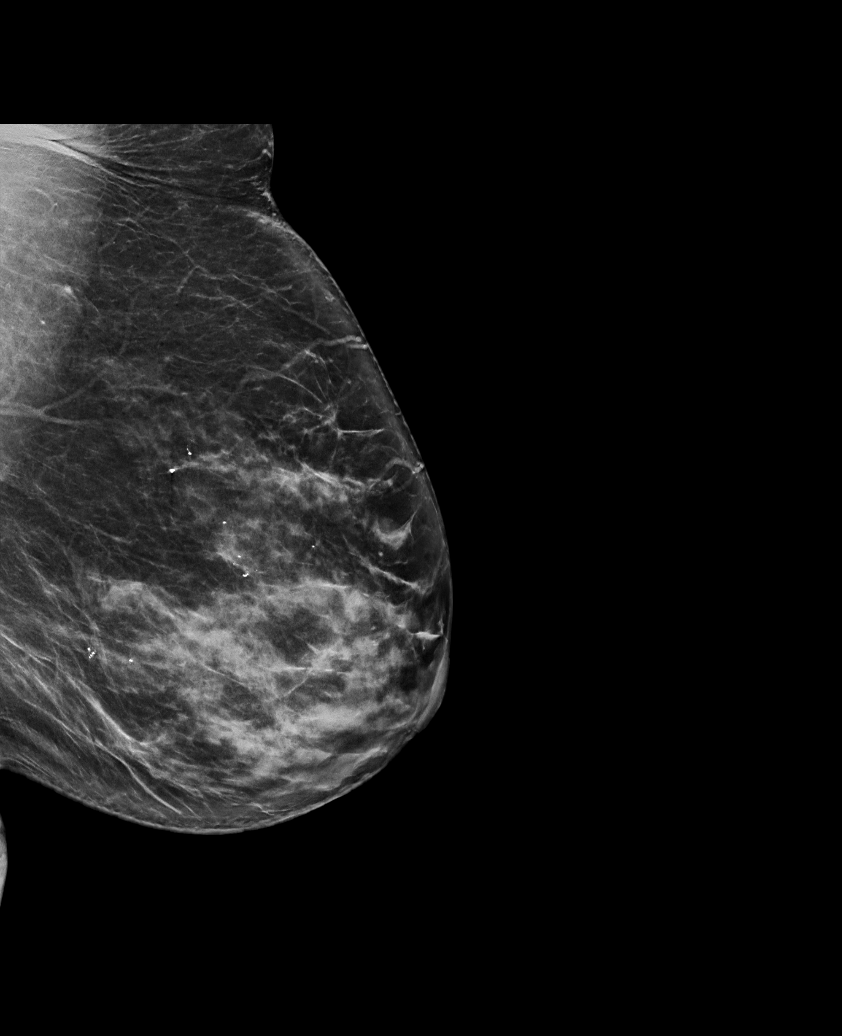

[6 of 36 positions shown; findings below may reference images not displayed]

ACR Breast Density Category c: The breast tissue is heterogeneously
dense, which may obscure small masses.
FINDINGS: There are no findings suspicious for malignancy.
IMPRESSION: No mammographic evidence of malignancy. A result letter of this
screening mammogram will be mailed directly to the patient.

RECOMMENDATION:
Screening mammogram in one year. (Code:Q3-W-BC3)

BI-RADS CATEGORY  1: Negative.

## 2023-08-10 ENCOUNTER — Other Ambulatory Visit: Payer: Self-pay | Admitting: Family Medicine

## 2023-08-10 DIAGNOSIS — Z0000 Encounter for general adult medical examination without abnormal findings: Secondary | ICD-10-CM

## 2023-09-06 DIAGNOSIS — I10 Essential (primary) hypertension: Secondary | ICD-10-CM | POA: Diagnosis not present

## 2023-09-06 DIAGNOSIS — E782 Mixed hyperlipidemia: Secondary | ICD-10-CM | POA: Diagnosis not present

## 2023-09-06 DIAGNOSIS — E1169 Type 2 diabetes mellitus with other specified complication: Secondary | ICD-10-CM | POA: Diagnosis not present

## 2023-09-16 ENCOUNTER — Ambulatory Visit

## 2023-09-23 ENCOUNTER — Ambulatory Visit
Admission: RE | Admit: 2023-09-23 | Discharge: 2023-09-23 | Disposition: A | Discharge status: Home / Self Care | Source: Ambulatory Visit | Attending: Family Medicine | Admitting: Family Medicine

## 2023-09-23 DIAGNOSIS — Z1231 Encounter for screening mammogram for malignant neoplasm of breast: Secondary | ICD-10-CM | POA: Diagnosis not present

## 2023-09-23 DIAGNOSIS — Z0000 Encounter for general adult medical examination without abnormal findings: Secondary | ICD-10-CM
